# Patient Record
Sex: Female | Born: 2000 | Race: Black or African American | Hispanic: No | Marital: Single
Health system: Southern US, Community
[De-identification: ages and names within clinical notes are randomized; demographics above are authoritative.]

## PROBLEM LIST (undated history)

## (undated) DIAGNOSIS — F419 Anxiety disorder, unspecified: Secondary | ICD-10-CM

## (undated) DIAGNOSIS — J45909 Unspecified asthma, uncomplicated: Secondary | ICD-10-CM

## (undated) HISTORY — DX: Anxiety disorder, unspecified: F41.9

---
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
## 2022-01-20 DIAGNOSIS — Z0000 Encounter for general adult medical examination without abnormal findings: Secondary | ICD-10-CM | POA: Diagnosis not present

## 2022-01-20 DIAGNOSIS — J4520 Mild intermittent asthma, uncomplicated: Secondary | ICD-10-CM | POA: Diagnosis not present

## 2022-01-20 DIAGNOSIS — Z68.41 Body mass index (BMI) pediatric, greater than or equal to 95th percentile for age: Secondary | ICD-10-CM | POA: Diagnosis not present

## 2022-01-20 DIAGNOSIS — Z713 Dietary counseling and surveillance: Secondary | ICD-10-CM | POA: Diagnosis not present

## 2022-01-20 DIAGNOSIS — Z419 Encounter for procedure for purposes other than remedying health state, unspecified: Secondary | ICD-10-CM | POA: Diagnosis not present

## 2022-01-20 DIAGNOSIS — Z23 Encounter for immunization: Secondary | ICD-10-CM | POA: Diagnosis not present

## 2022-01-20 DIAGNOSIS — L83 Acanthosis nigricans: Secondary | ICD-10-CM | POA: Diagnosis not present

## 2022-02-10 DIAGNOSIS — L83 Acanthosis nigricans: Secondary | ICD-10-CM | POA: Diagnosis not present

## 2022-02-10 DIAGNOSIS — Z13220 Encounter for screening for lipoid disorders: Secondary | ICD-10-CM | POA: Diagnosis not present

## 2022-02-20 DIAGNOSIS — Z419 Encounter for procedure for purposes other than remedying health state, unspecified: Secondary | ICD-10-CM | POA: Diagnosis not present

## 2022-03-23 DIAGNOSIS — Z419 Encounter for procedure for purposes other than remedying health state, unspecified: Secondary | ICD-10-CM | POA: Diagnosis not present

## 2022-04-21 DIAGNOSIS — Z419 Encounter for procedure for purposes other than remedying health state, unspecified: Secondary | ICD-10-CM | POA: Diagnosis not present

## 2022-05-22 DIAGNOSIS — Z419 Encounter for procedure for purposes other than remedying health state, unspecified: Secondary | ICD-10-CM | POA: Diagnosis not present

## 2022-06-21 DIAGNOSIS — Z419 Encounter for procedure for purposes other than remedying health state, unspecified: Secondary | ICD-10-CM | POA: Diagnosis not present

## 2022-07-22 DIAGNOSIS — Z419 Encounter for procedure for purposes other than remedying health state, unspecified: Secondary | ICD-10-CM | POA: Diagnosis not present

## 2022-08-21 DIAGNOSIS — Z419 Encounter for procedure for purposes other than remedying health state, unspecified: Secondary | ICD-10-CM | POA: Diagnosis not present

## 2022-09-21 DIAGNOSIS — Z419 Encounter for procedure for purposes other than remedying health state, unspecified: Secondary | ICD-10-CM | POA: Diagnosis not present

## 2022-10-22 DIAGNOSIS — Z419 Encounter for procedure for purposes other than remedying health state, unspecified: Secondary | ICD-10-CM | POA: Diagnosis not present

## 2022-11-21 DIAGNOSIS — Z419 Encounter for procedure for purposes other than remedying health state, unspecified: Secondary | ICD-10-CM | POA: Diagnosis not present

## 2022-11-22 DIAGNOSIS — J4520 Mild intermittent asthma, uncomplicated: Secondary | ICD-10-CM | POA: Diagnosis not present

## 2022-11-22 DIAGNOSIS — Z7189 Other specified counseling: Secondary | ICD-10-CM | POA: Diagnosis not present

## 2022-11-22 DIAGNOSIS — Z23 Encounter for immunization: Secondary | ICD-10-CM | POA: Diagnosis not present

## 2022-11-22 DIAGNOSIS — M205X1 Other deformities of toe(s) (acquired), right foot: Secondary | ICD-10-CM | POA: Diagnosis not present

## 2022-11-22 DIAGNOSIS — Z713 Dietary counseling and surveillance: Secondary | ICD-10-CM | POA: Diagnosis not present

## 2022-11-22 DIAGNOSIS — Z1330 Encounter for screening examination for mental health and behavioral disorders, unspecified: Secondary | ICD-10-CM | POA: Diagnosis not present

## 2022-11-22 DIAGNOSIS — F411 Generalized anxiety disorder: Secondary | ICD-10-CM | POA: Diagnosis not present

## 2022-11-22 DIAGNOSIS — Z0001 Encounter for general adult medical examination with abnormal findings: Secondary | ICD-10-CM | POA: Diagnosis not present

## 2022-11-22 DIAGNOSIS — L209 Atopic dermatitis, unspecified: Secondary | ICD-10-CM | POA: Diagnosis not present

## 2022-11-22 DIAGNOSIS — J309 Allergic rhinitis, unspecified: Secondary | ICD-10-CM | POA: Diagnosis not present

## 2022-11-22 DIAGNOSIS — Z6833 Body mass index (BMI) 33.0-33.9, adult: Secondary | ICD-10-CM | POA: Diagnosis not present

## 2022-12-22 DIAGNOSIS — Z419 Encounter for procedure for purposes other than remedying health state, unspecified: Secondary | ICD-10-CM | POA: Diagnosis not present

## 2023-01-03 ENCOUNTER — Ambulatory Visit
Admission: EM | Admit: 2023-01-03 | Discharge: 2023-01-03 | Disposition: A | Discharge status: Home / Self Care | Payer: Medicaid Other | Attending: Family Medicine | Admitting: Family Medicine

## 2023-01-03 DIAGNOSIS — J019 Acute sinusitis, unspecified: Secondary | ICD-10-CM | POA: Diagnosis not present

## 2023-01-03 DIAGNOSIS — H66001 Acute suppurative otitis media without spontaneous rupture of ear drum, right ear: Secondary | ICD-10-CM | POA: Diagnosis not present

## 2023-01-03 MED ORDER — PREDNISONE 10 MG (21) PO TBPK: ORAL_TABLET | Freq: Every day | ORAL | 0 refills | Status: DC

## 2023-01-03 MED ORDER — CEFDINIR 300 MG PO CAPS: 300.0000 mg | ORAL_CAPSULE | Freq: Two times a day (BID) | ORAL | 0 refills | Status: DC

## 2023-01-03 NOTE — ED Triage Notes (Signed)
Pt c/o R sided facial pain & congestion x3 wks & ear pain x1 day. Has tried dayquil & zinc w/o relief.

## 2023-01-03 NOTE — Discharge Instructions (Signed)
Stop by the pharmacy to pick up your prescriptions.  Follow up with your primary care provider as needed. You may need to see an ear, nose and throat doctor, if symptoms don't improve.

## 2023-01-03 NOTE — ED Provider Notes (Signed)
[facility]    CSN: [id number] Arrival date & time: 01/03/23  1712      History   Chief Complaint Chief Complaint  Patient presents with   Facial Pain    HPI Destiny Jensen is a 22 y.o. female.   HPI  History obtained from the patient. Destiny Jensen presents for right sided facial pain that started at the end of October (10/25) that has gotten worse. She has right ear pain, coughing, sneezing and nasal congestion. No fever, nausea, diarrhea. Had 1 episode of vomiting last Tuesday. She has had sinus congestion and sinus pain for past 3-4 weeks. Has headache but not one currently.        Past Medical History:  Diagnosis Date   Anxiety    Asthma     Patient Active Problem List   Diagnosis Date Noted   Attention deficit hyperactivity disorder (ADHD), combined type 04/23/2018   Other specified anxiety disorders 04/23/2018    History reviewed. No pertinent surgical history.  OB History   No obstetric history on file.      Home Medications    Prior to Admission medications   Medication Sig Start Date End Date Taking? Authorizing Provider  albuterol (PROVENTIL HFA;VENTOLIN HFA) 108 (90 Base) MCG/ACT inhaler Inhale 1-2 puffs into the lungs every 6 (six) hours as needed for wheezing or shortness of breath. 04/12/15  Yes Hagler, Jami L, PA-C  albuterol (PROVENTIL) (2.5 MG/3ML) 0.083% nebulizer solution Take 2.5 mg by nebulization every 6 (six) hours as needed for wheezing or shortness of breath.   Yes [provider]  amitriptyline (ELAVIL) 25 MG tablet Take 1 tablet (25 mg total) by mouth at bedtime. 03/12/19  Yes Keturah Shavers, MD  buPROPion (WELLBUTRIN XL) 300 MG 24 hr tablet Take 1 tablet (300 mg total) by mouth daily. 09/13/18  Yes Darcel Smalling, MD  escitalopram (LEXAPRO) 20 MG tablet TAKE 1 TABLET BY MOUTH EVERY DAY 10/08/18  Yes Darcel Smalling, MD  FLOVENT HFA 44 MCG/ACT inhaler Inhale 2 puffs into the lungs 2 (two) times daily. 03/08/18  Yes  [provider]  fluticasone (FLONASE) 50 MCG/ACT nasal spray Place 1 spray into both nostrils daily.   Yes [provider]  loratadine (CLARITIN) 10 MG tablet Take 10 mg by mouth daily.   Yes [provider]  Magnesium Oxide 500 MG TABS Take 1 tablet (500 mg total) by mouth daily. 03/12/19  Yes Keturah Shavers, MD  naproxen (NAPROSYN) 500 MG tablet Take 500 mg by mouth 2 (two) times daily. 11/22/22  Yes [provider]  predniSONE (STERAPRED UNI-PAK 21 TAB) 10 MG (21) TBPK tablet Take by mouth daily. Take 6 tabs by mouth daily for 1, then 5 tabs for 1 day, then 4 tabs for 1 day, then 3 tabs for 1 day, then 2 tabs for 1 day, then 1 tab for 1 day. 01/03/23  Yes Dashiell Franchino, DO  riboflavin (VITAMIN B-2) 100 MG TABS tablet Take 1 tablet (100 mg total) by mouth daily. 03/12/19  Yes Keturah Shavers, MD  sertraline (ZOLOFT) 25 MG tablet Take 25 mg by mouth daily. 11/22/22  Yes [provider]  cefdinir (OMNICEF) 300 MG capsule Take 1 capsule (300 mg total) by mouth 2 (two) times daily. 01/03/23  Yes Lyzette Reinhardt, DO  lisdexamfetamine (VYVANSE) 30 MG capsule Take 1 capsule (30 mg total) by mouth daily. Patient not taking: Reported on 03/12/2019 07/23/18   Darcel Smalling, MD    Family History  Family History  Problem Relation Age of Onset   Migraines Mother    Seizures Neg Hx    Autism Neg Hx    ADD / ADHD Neg Hx    Anxiety disorder Neg Hx    Depression Neg Hx    Bipolar disorder Neg Hx    Schizophrenia Neg Hx     Social History Social History   Tobacco Use   Smoking status: Passive Smoke Exposure - Never Smoker   Smokeless tobacco: Never  Vaping Use   Vaping status: Never Used  Substance Use Topics   Alcohol use: No   Drug use: No     Allergies   Patient has no known allergies.   Review of Systems Review of Systems: negative unless otherwise stated in HPI.      Physical Exam Triage Vital Signs ED Triage Vitals  Encounter  Vitals Group     BP 01/03/23 1744 131/83     Systolic BP Percentile --      Diastolic BP Percentile --      Pulse Rate 01/03/23 1744 100     Resp 01/03/23 1744 16     Temp 01/03/23 1744 99 F (37.2 C)     Temp Source 01/03/23 1744 Oral     SpO2 01/03/23 1744 98 %     Weight 01/03/23 1743 210 lb (95.3 kg)     Height 01/03/23 1743 5\' 5"  (1.651 m)     Head Circumference --      Peak Flow --      Pain Score 01/03/23 1748 8     Pain Loc --      Pain Education --      Exclude from Growth Chart --    No data found.  Updated Vital Signs BP 131/83 (BP Location: Left Arm)   Pulse 100   Temp 99 F (37.2 C) (Oral)   Resp 16   Ht 5\' 5"  (1.651 m)   Wt 95.3 kg   LMP 11/21/2022 (Approximate)   SpO2 98%   BMI 34.95 kg/m   Visual Acuity Right Eye Distance:   Left Eye Distance:   Bilateral Distance:    Right Eye Near:   Left Eye Near:    Bilateral Near:     Physical Exam GEN:     alert, non-toxic appearing female in no distress    HENT:  mucus membranes moist, oropharyngeal without lesions or erythema, 2+ tonsillar hypertrophy or exudates,  moderate erythematous edematous turbinates, clear nasal discharge,right TM bulging and opaque, maxillary sinus tendenress, no frontal since tenderness  EYES:   pupils equal and reactive, no scleral injection or discharge NECK:  normal ROM, + right lymphadenopathy, no meningismus   RESP:  no increased work of breathing, clear to auscultation bilaterally CVS:   regular rate and rhythm Skin:   warm and dry, no rash on visible skin    UC Treatments / Results  Labs (all labs ordered are listed, but only abnormal results are displayed) Labs Reviewed - No data to display  EKG   Radiology No results found.  Procedures Procedures (including critical care time)  Medications Ordered in UC Medications - No data to display  Initial Impression / Assessment and Plan / UC Course  I have reviewed the triage vital signs and the nursing  notes.  Pertinent labs & imaging results that were available during my care of the patient were reviewed by me and considered in my medical decision making (see chart for details).  Pt is a 22 y.o. female who presents for 3-4 weeks of nasal congestion with right facial pain and ear pain.   Destiny Jensen is afebrile here.  Satting wellon room air. Overall pt is  non-toxic appearing, well hydrated, without respiratory distress. Pulmonary exam is unremarkable except for frequent cough. After shared decision making, we will not pursue chest x-ray at this time.  COVID  and influenza testing deferred due to length of symptoms.  She has maxillary sinus tenderness on the right with evidence of beginning acute otitis media also on the right.    Treat acute sinusitis with right acute otitis media with steroids and antibiotics as below.  Typical duration of symptoms discussed. Return and ED precautions given and patient voiced understanding.   Discussed MDM, treatment plan and plan for follow-up with patient who agrees with plan.     Final Clinical Impressions(s) / UC Diagnoses   Final diagnoses:  Acute non-recurrent sinusitis, unspecified location  Non-recurrent acute suppurative otitis media of right ear without spontaneous rupture of tympanic membrane     Discharge Instructions      Stop by the pharmacy to pick up your prescriptions.  Follow up with your primary care provider as needed. You may need to see an ear, nose and throat doctor, if symptoms don't improve.       ED Prescriptions     Medication Sig Dispense Auth. Provider   cefdinir (OMNICEF) 300 MG capsule Take 1 capsule (300 mg total) by mouth 2 (two) times daily. 14 capsule Halia Franey, DO   predniSONE (STERAPRED UNI-PAK 21 TAB) 10 MG (21) TBPK tablet Take by mouth daily. Take 6 tabs by mouth daily for 1, then 5 tabs for 1 day, then 4 tabs for 1 day, then 3 tabs for 1 day, then 2 tabs for 1 day, then 1 tab for 1 day. 21  tablet Katha Cabal, DO      PDMP not reviewed this encounter.   Katha Cabal, DO 01/03/23 1824

## 2023-01-09 ENCOUNTER — Ambulatory Visit
Admission: EM | Admit: 2023-01-09 | Discharge: 2023-01-09 | Disposition: A | Discharge status: Home / Self Care | Payer: Medicaid Other | Attending: Emergency Medicine | Admitting: Emergency Medicine

## 2023-01-09 DIAGNOSIS — J029 Acute pharyngitis, unspecified: Secondary | ICD-10-CM | POA: Diagnosis not present

## 2023-01-09 DIAGNOSIS — H9203 Otalgia, bilateral: Secondary | ICD-10-CM

## 2023-01-09 LAB — GROUP A STREP BY PCR: Group A Strep by PCR: NOT DETECTED

## 2023-01-09 NOTE — ED Provider Notes (Signed)
[facility]    CSN: [id number] Arrival date & time: 01/09/23  1705      History   Chief Complaint Chief Complaint  Patient presents with   Otalgia    HPI Destiny Jensen is a 22 y.o. female.   22 year old female, Destiny Jensen, presents to urgent care for evaluation of bilateral ear pain x 1 week.  Patient was seen 1113 for the same issue was given cefdinir and prednisone patient states she feels little bit better but has not gone away completely, points to the tragus bilaterally as point of tenderness.  Patient also complaining of sore throat.  Patient is afebrile in office, vital signs are stable  The history is provided by the patient. No language interpreter was used.    Past Medical History:  Diagnosis Date   Anxiety    Asthma     Patient Active Problem List   Diagnosis Date Noted   Otalgia of both ears 01/09/2023   Sore throat 01/09/2023   Attention deficit hyperactivity disorder (ADHD), combined type 04/23/2018   Other specified anxiety disorders 04/23/2018    History reviewed. No pertinent surgical history.  OB History   No obstetric history on file.      Home Medications    Prior to Admission medications   Medication Sig Start Date End Date Taking? Authorizing Provider  albuterol (PROVENTIL HFA;VENTOLIN HFA) 108 (90 Base) MCG/ACT inhaler Inhale 1-2 puffs into the lungs every 6 (six) hours as needed for wheezing or shortness of breath. 04/12/15  Yes Hagler, Jami L, PA-C  albuterol (PROVENTIL) (2.5 MG/3ML) 0.083% nebulizer solution Take 2.5 mg by nebulization every 6 (six) hours as needed for wheezing or shortness of breath.   Yes [provider]  amitriptyline (ELAVIL) 25 MG tablet Take 1 tablet (25 mg total) by mouth at bedtime. 03/12/19  Yes Keturah Shavers, MD  buPROPion (WELLBUTRIN XL) 300 MG 24 hr tablet Take 1 tablet (300 mg total) by mouth daily. 09/13/18  Yes Darcel Smalling, MD  cefdinir (OMNICEF) 300 MG capsule Take 1 capsule  (300 mg total) by mouth 2 (two) times daily. 01/03/23  Yes Brimage, Vondra, DO  escitalopram (LEXAPRO) 20 MG tablet TAKE 1 TABLET BY MOUTH EVERY DAY 10/08/18  Yes Darcel Smalling, MD  FLOVENT HFA 44 MCG/ACT inhaler Inhale 2 puffs into the lungs 2 (two) times daily. 03/08/18  Yes [provider]  fluticasone (FLONASE) 50 MCG/ACT nasal spray Place 1 spray into both nostrils daily.   Yes [provider]  loratadine (CLARITIN) 10 MG tablet Take 10 mg by mouth daily.   Yes [provider]  Magnesium Oxide 500 MG TABS Take 1 tablet (500 mg total) by mouth daily. 03/12/19  Yes Keturah Shavers, MD  naproxen (NAPROSYN) 500 MG tablet Take 500 mg by mouth 2 (two) times daily. 11/22/22  Yes [provider]  riboflavin (VITAMIN B-2) 100 MG TABS tablet Take 1 tablet (100 mg total) by mouth daily. 03/12/19  Yes Keturah Shavers, MD  sertraline (ZOLOFT) 25 MG tablet Take 25 mg by mouth daily. 11/22/22  Yes [provider]  lisdexamfetamine (VYVANSE) 30 MG capsule Take 1 capsule (30 mg total) by mouth daily. Patient not taking: Reported on 03/12/2019 07/23/18   Darcel Smalling, MD  predniSONE (STERAPRED UNI-PAK 21 TAB) 10 MG (21) TBPK tablet Take by mouth daily. Take 6 tabs by mouth daily for 1, then 5 tabs for 1 day, then 4 tabs for 1 day, then 3 tabs for  1 day, then 2 tabs for 1 day, then 1 tab for 1 day. 01/03/23   Katha Cabal, DO    Family History Family History  Problem Relation Age of Onset   Migraines Mother    Seizures Neg Hx    Autism Neg Hx    ADD / ADHD Neg Hx    Anxiety disorder Neg Hx    Depression Neg Hx    Bipolar disorder Neg Hx    Schizophrenia Neg Hx     Social History Social History   Tobacco Use   Smoking status: Passive Smoke Exposure - Never Smoker   Smokeless tobacco: Never  Vaping Use   Vaping status: Never Used  Substance Use Topics   Alcohol use: No   Drug use: No     Allergies   Patient has no known allergies.   Review of  Systems Review of Systems  Constitutional:  Negative for fever.  HENT:  Positive for ear pain and sore throat. Negative for ear discharge and facial swelling.   All other systems reviewed and are negative.    Physical Exam Triage Vital Signs ED Triage Vitals [01/09/23 1718]  Encounter Vitals Group     BP      Systolic BP Percentile      Diastolic BP Percentile      Pulse      Resp      Temp      Temp src      SpO2      Weight 210 lb (95.3 kg)     Height 5\' 5"  (1.651 m)     Head Circumference      Peak Flow      Pain Score      Pain Loc      Pain Education      Exclude from Growth Chart    No data found.  Updated Vital Signs BP 139/85 (BP Location: Right Arm)   Pulse 75   Temp 98.5 F (36.9 C) (Oral)   Resp 16   Ht 5\' 5"  (1.651 m)   Wt 210 lb (95.3 kg)   LMP 11/21/2022 (Approximate)   SpO2 99%   BMI 34.95 kg/m   Visual Acuity Right Eye Distance:   Left Eye Distance:   Bilateral Distance:    Right Eye Near:   Left Eye Near:    Bilateral Near:     Physical Exam Vitals and nursing note reviewed.  Constitutional:      Appearance: She is well-developed, well-groomed and overweight.  HENT:     Head: Normocephalic.     Right Ear: Tympanic membrane normal.     Left Ear: Tympanic membrane normal.     Ears:     Comments: No mastoid tenderness or erythema, no trismus    Nose: Nose normal.     Mouth/Throat:     Lips: Pink.     Mouth: Mucous membranes are moist.     Pharynx: Uvula midline. Posterior oropharyngeal erythema present. No oropharyngeal exudate.     Tonsils: No tonsillar exudate or tonsillar abscesses.  Cardiovascular:     Rate and Rhythm: Normal rate and regular rhythm.     Pulses: Normal pulses.     Heart sounds: Normal heart sounds.  Pulmonary:     Effort: Pulmonary effort is normal.     Breath sounds: Normal breath sounds and air entry.  Neurological:     General: No focal deficit present.     Mental Status: She is  alert and oriented to  person, place, and time.     GCS: GCS eye subscore is 4. GCS verbal subscore is 5. GCS motor subscore is 6.     Cranial Nerves: No cranial nerve deficit.     Sensory: No sensory deficit.  Psychiatric:        Attention and Perception: Attention normal.        Mood and Affect: Mood normal.        Speech: Speech normal.        Behavior: Behavior normal. Behavior is cooperative.      UC Treatments / Results  Labs (all labs ordered are listed, but only abnormal results are displayed) Labs Reviewed  GROUP A STREP BY PCR    EKG   Radiology No results found.  Procedures Procedures (including critical care time)  Medications Ordered in UC Medications - No data to display  Initial Impression / Assessment and Plan / UC Course  I have reviewed the triage vital signs and the nursing notes.  Pertinent labs & imaging results that were available during my care of the patient were reviewed by me and considered in my medical decision making (see chart for details).  Clinical Course as of 01/09/23 2058  Tue Jan 09, 2023  1734 Strep ordered [JD]  6213 Strep is negative will refer to ENT for further evaluation, complete meds.  [JD]    Clinical Course User Index [JD] Tymber Stallings, Para March, NP  Discussed exam findings and plan of care with patient, referred to ENT ,strict go to ER precautions given.   Patient verbalized understanding to this provider.  Ddx: Otalgia bilaterally, sore throat, allergies, viral illness Final Clinical Impressions(s) / UC Diagnoses   Final diagnoses:  Otalgia of both ears  Sore throat     Discharge Instructions      Your strep test is negative. Take daily allergy med of choice, take flonase as label directed(both are over the counter). Call and follow up with ENT. Return as needed.     ED Prescriptions   None    PDMP not reviewed this encounter.   Clancy Gourd, NP 01/09/23 [phone]

## 2023-01-09 NOTE — Discharge Instructions (Addendum)
Your strep test is negative. Take daily allergy med of choice, take flonase as label directed(both are over the counter). Call and follow up with ENT. Return as needed.

## 2023-01-09 NOTE — ED Triage Notes (Signed)
Pt c/o bilateral ear pain x1 wk. Was seen on 11/13 for the same issue. Was given cefdinir & prednisone w/o relief.

## 2023-01-21 DIAGNOSIS — Z419 Encounter for procedure for purposes other than remedying health state, unspecified: Secondary | ICD-10-CM | POA: Diagnosis not present

## 2023-02-21 DIAGNOSIS — Z419 Encounter for procedure for purposes other than remedying health state, unspecified: Secondary | ICD-10-CM | POA: Diagnosis not present

## 2023-03-24 DIAGNOSIS — Z419 Encounter for procedure for purposes other than remedying health state, unspecified: Secondary | ICD-10-CM | POA: Diagnosis not present

## 2023-04-17 ENCOUNTER — Ambulatory Visit
Admission: EM | Admit: 2023-04-17 | Discharge: 2023-04-17 | Disposition: A | Discharge status: Home / Self Care | Payer: Medicaid Other | Attending: Physician Assistant | Admitting: Physician Assistant

## 2023-04-17 DIAGNOSIS — K047 Periapical abscess without sinus: Secondary | ICD-10-CM | POA: Diagnosis not present

## 2023-04-17 DIAGNOSIS — K029 Dental caries, unspecified: Secondary | ICD-10-CM | POA: Diagnosis not present

## 2023-04-17 MED ORDER — AMOXICILLIN-POT CLAVULANATE 875-125 MG PO TABS: 1.0000 | ORAL_TABLET | Freq: Two times a day (BID) | ORAL | 0 refills | Status: AC

## 2023-04-17 NOTE — Discharge Instructions (Addendum)
-  Take antibiotics for dental infection. - May apply ice to cheek to help with swelling discomfort.  May take Tylenol Motrin for pain. - If fever, increased swelling, worsening pain before you see the dentist go to the ER. - Follow-up with dentist.

## 2023-04-17 NOTE — ED Triage Notes (Signed)
 Pt c/o lower L sided dental pain x1 day. Is sch to see dentist on Monday.

## 2023-04-17 NOTE — ED Provider Notes (Signed)
 [facility]    CSN: [id number] Arrival date & time: 04/17/23  1815      History   Chief Complaint Chief Complaint  Patient presents with   Dental Pain    HPI Destiny Jensen is a 23 y.o. female presenting for pain of left third molar on the lower side for the past couple days.  She says she noticed that the tooth broke about a month ago.  Also reports the left cheek seems to be a little swollen.  No associated fevers.  Has been able to manage her pain with ibuprofen.  Has an appointment to see her dentist in about 6 days but would like something to help treat infection at this time.  HPI  Past Medical History:  Diagnosis Date   Anxiety    Asthma     Patient Active Problem List   Diagnosis Date Noted   Otalgia of both ears 01/09/2023   Sore throat 01/09/2023   Attention deficit hyperactivity disorder (ADHD), combined type 04/23/2018   Other specified anxiety disorders 04/23/2018    History reviewed. No pertinent surgical history.  OB History   No obstetric history on file.      Home Medications    Prior to Admission medications   Medication Sig Start Date End Date Taking? Authorizing Provider  albuterol (PROVENTIL HFA;VENTOLIN HFA) 108 (90 Base) MCG/ACT inhaler Inhale 1-2 puffs into the lungs every 6 (six) hours as needed for wheezing or shortness of breath. 04/12/15  Yes Hagler, Jami L, PA-C  albuterol (PROVENTIL) (2.5 MG/3ML) 0.083% nebulizer solution Take 2.5 mg by nebulization every 6 (six) hours as needed for wheezing or shortness of breath.   Yes [provider]  amitriptyline (ELAVIL) 25 MG tablet Take 1 tablet (25 mg total) by mouth at bedtime. 03/12/19  Yes Keturah Shavers, MD  amoxicillin-clavulanate (AUGMENTIN) 875-125 MG tablet Take 1 tablet by mouth every 12 (twelve) hours for 7 days. 04/17/23 04/24/23 Yes Shirlee Latch, PA-C  buPROPion (WELLBUTRIN XL) 300 MG 24 hr tablet Take 1 tablet (300 mg total) by mouth daily. 09/13/18  Yes Darcel Smalling, MD  escitalopram (LEXAPRO) 20 MG tablet TAKE 1 TABLET BY MOUTH EVERY DAY 10/08/18  Yes Darcel Smalling, MD  FLOVENT HFA 44 MCG/ACT inhaler Inhale 2 puffs into the lungs 2 (two) times daily. 03/08/18  Yes [provider]  fluticasone (FLONASE) 50 MCG/ACT nasal spray Place 1 spray into both nostrils daily.   Yes [provider]  loratadine (CLARITIN) 10 MG tablet Take 10 mg by mouth daily.   Yes [provider]  Magnesium Oxide 500 MG TABS Take 1 tablet (500 mg total) by mouth daily. 03/12/19  Yes Keturah Shavers, MD  naproxen (NAPROSYN) 500 MG tablet Take 500 mg by mouth 2 (two) times daily. 11/22/22  Yes [provider]  riboflavin (VITAMIN B-2) 100 MG TABS tablet Take 1 tablet (100 mg total) by mouth daily. 03/12/19  Yes Keturah Shavers, MD  sertraline (ZOLOFT) 25 MG tablet Take 25 mg by mouth daily. 11/22/22  Yes [provider]  lisdexamfetamine (VYVANSE) 30 MG capsule Take 1 capsule (30 mg total) by mouth daily. Patient not taking: Reported on 03/12/2019 07/23/18   Darcel Smalling, MD    Family History Family History  Problem Relation Age of Onset   Migraines Mother    Seizures Neg Hx    Autism Neg Hx    ADD / ADHD Neg Hx    Anxiety disorder Neg Hx  Depression Neg Hx    Bipolar disorder Neg Hx    Schizophrenia Neg Hx     Social History Social History   Tobacco Use   Smoking status: Passive Smoke Exposure - Never Smoker   Smokeless tobacco: Never  Vaping Use   Vaping status: Never Used  Substance Use Topics   Alcohol use: No   Drug use: No     Allergies   Patient has no known allergies.   Review of Systems Review of Systems  Constitutional:  Negative for fatigue and fever.  HENT:  Positive for dental problem and facial swelling. Negative for trouble swallowing.   Neurological:  Negative for weakness and headaches.  Hematological:  Negative for adenopathy.     Physical Exam Triage Vital Signs ED Triage Vitals   Encounter Vitals Group     BP 04/17/23 1819 (!) 143/88     Systolic BP Percentile --      Diastolic BP Percentile --      Pulse Rate 04/17/23 1819 83     Resp 04/17/23 1819 16     Temp 04/17/23 1819 98.4 F (36.9 C)     Temp Source 04/17/23 1819 Oral     SpO2 04/17/23 1819 98 %     Weight 04/17/23 1818 210 lb (95.3 kg)     Height 04/17/23 1818 5\' 5"  (1.651 m)     Head Circumference --      Peak Flow --      Pain Score 04/17/23 1822 5     Pain Loc --      Pain Education --      Exclude from Growth Chart --    No data found.  Updated Vital Signs BP (!) 143/88 (BP Location: Left Arm)   Pulse 83   Temp 98.4 F (36.9 C) (Oral)   Resp 16   Ht 5\' 5"  (1.651 m)   Wt 210 lb (95.3 kg)   LMP 04/02/2023 (Approximate)   SpO2 98%   BMI 34.95 kg/m   Visual Acuity Right Eye Distance:   Left Eye Distance:   Bilateral Distance:    Right Eye Near:   Left Eye Near:    Bilateral Near:     Physical Exam Vitals and nursing note reviewed.  Constitutional:      General: She is not in acute distress.    Appearance: Normal appearance. She is not ill-appearing or toxic-appearing.  HENT:     Head: Normocephalic and atraumatic.     Right Ear: Tympanic membrane, ear canal and external ear normal.     Left Ear: Tympanic membrane, ear canal and external ear normal.     Nose: Nose normal.     Mouth/Throat:     Mouth: Mucous membranes are moist.     Dentition: Dental caries (left lower 3rd molar with decay and is broken. TTP) present.     Pharynx: Oropharynx is clear.     Comments: No significant facial swelling Eyes:     General: No scleral icterus.       Right eye: No discharge.        Left eye: No discharge.     Conjunctiva/sclera: Conjunctivae normal.  Cardiovascular:     Rate and Rhythm: Normal rate.  Pulmonary:     Effort: Pulmonary effort is normal. No respiratory distress.  Musculoskeletal:     Cervical back: Neck supple.  Skin:    General: Skin is dry.  Neurological:      General: No focal  deficit present.     Mental Status: She is alert. Mental status is at baseline.     Motor: No weakness.     Gait: Gait normal.  Psychiatric:        Mood and Affect: Mood normal.        Behavior: Behavior normal.      UC Treatments / Results  Labs (all labs ordered are listed, but only abnormal results are displayed) Labs Reviewed - No data to display  EKG   Radiology No results found.  Procedures Procedures (including critical care time)  Medications Ordered in UC Medications - No data to display  Initial Impression / Assessment and Plan / UC Course  I have reviewed the triage vital signs and the nursing notes.  Pertinent labs & imaging results that were available during my care of the patient were reviewed by me and considered in my medical decision making (see chart for details).   23 year old female presents for pain of the left third molar on the lower side for the past couple days.  Has been taking ibuprofen for pain which has helped.  Has an appointment to see dentist in a little less than a week.  Vitals are stable.  Afebrile.  Overall well-appearing.  On exam she has decay and broken left lower third molar will treat for infection at this time with Augmentin.  Advised to continue ibuprofen and Tylenol, rest and fluids.  Reviewed return and ER precautions, otherwise follow-up as scheduled with dentist.   Final Clinical Impressions(s) / UC Diagnoses   Final diagnoses:  Dental infection  Dental caries     Discharge Instructions      -Take antibiotics for dental infection. - May apply ice to cheek to help with swelling discomfort.  May take Tylenol Motrin for pain. - If fever, increased swelling, worsening pain before you see the dentist go to the ER. - Follow-up with dentist.     ED Prescriptions     Medication Sig Dispense Auth. Provider   amoxicillin-clavulanate (AUGMENTIN) 875-125 MG tablet Take 1 tablet by mouth every 12 (twelve)  hours for 7 days. 14 tablet Shirlee Latch, PA-C      I have reviewed the PDMP during this encounter.   Shirlee Latch, PA-C 04/17/23 Windell Moment

## 2023-04-21 DIAGNOSIS — Z419 Encounter for procedure for purposes other than remedying health state, unspecified: Secondary | ICD-10-CM | POA: Diagnosis not present

## 2023-05-15 ENCOUNTER — Encounter: Payer: Self-pay | Admitting: Emergency Medicine

## 2023-05-15 ENCOUNTER — Ambulatory Visit
Admission: EM | Admit: 2023-05-15 | Discharge: 2023-05-15 | Disposition: A | Discharge status: Home / Self Care | Attending: Physician Assistant | Admitting: Physician Assistant

## 2023-05-15 DIAGNOSIS — K0889 Other specified disorders of teeth and supporting structures: Secondary | ICD-10-CM

## 2023-05-15 DIAGNOSIS — K029 Dental caries, unspecified: Secondary | ICD-10-CM | POA: Diagnosis not present

## 2023-05-15 MED ORDER — HYDROCODONE-ACETAMINOPHEN 5-325 MG PO TABS: 1.0000 | ORAL_TABLET | Freq: Four times a day (QID) | ORAL | 0 refills | Status: AC | PRN

## 2023-05-15 MED ORDER — KETOROLAC TROMETHAMINE 60 MG/2ML IM SOLN
30.0000 mg | Freq: Once | INTRAMUSCULAR | Status: AC
  Administered 2023-05-15: 30 mg via INTRAMUSCULAR

## 2023-05-15 MED ORDER — IBUPROFEN 800 MG PO TABS: 800.0000 mg | ORAL_TABLET | Freq: Three times a day (TID) | ORAL | 0 refills | Status: AC | PRN

## 2023-05-15 MED ORDER — AMOXICILLIN-POT CLAVULANATE 875-125 MG PO TABS
1.0000 | ORAL_TABLET | Freq: Two times a day (BID) | ORAL | 0 refills | Status: AC
Start: 2023-05-15 — End: 2023-05-25

## 2023-05-15 NOTE — Discharge Instructions (Addendum)
-  Make appointment with orthodontist to have the tooth pulled as soon as possible.  We will not refill pain medication. You will continue to have pain until you have the tooth removed.

## 2023-05-15 NOTE — ED Triage Notes (Signed)
 Pt presents with left side dental pain x 1 month. Pt was seen here on 04/17/23. She had a dentist appointment last week and had to be referred. No appointment has been scheduled at this time.

## 2023-05-15 NOTE — ED Provider Notes (Signed)
 [facility]    CSN: [id number] Arrival date & time: 05/15/23  1811      History   Chief Complaint Chief Complaint  Patient presents with   Dental Pain    HPI Destiny Jensen is a 23 y.o. female presenting for pain of left third molar on the lower side for the past month.  She says she noticed that the tooth broke about a 2 months ago.  Also reports the left cheek seems to be a little swollen.  No associated fevers.  Has been able to manage her pain with ibuprofen until 1 month ago.  Seen here by myself about a month ago and treated for dental infection and advised to follow-up with dentistry.  Saw the dentist recently and states she has been referred to orthodontist to have the tooth pulled.  Does not currently have an appointment set and says it will take her 2 to 3 weeks to get an appointment.  Has been taking ibuprofen and Tylenol high doses over-the-counter and says it is not helping the pain anymore.  She says she finally started to get a little bit of relief the last day of the antibiotics.    HPI  Past Medical History:  Diagnosis Date   Anxiety    Asthma     Patient Active Problem List   Diagnosis Date Noted   Otalgia of both ears 01/09/2023   Sore throat 01/09/2023   Attention deficit hyperactivity disorder (ADHD), combined type 04/23/2018   Other specified anxiety disorders 04/23/2018    History reviewed. No pertinent surgical history.  OB History   No obstetric history on file.      Home Medications    Prior to Admission medications   Medication Sig Start Date End Date Taking? Authorizing Provider  amoxicillin-clavulanate (AUGMENTIN) 875-125 MG tablet Take 1 tablet by mouth every 12 (twelve) hours for 10 days. 05/15/23 05/25/23 Yes Shirlee Latch, PA-C  HYDROcodone-acetaminophen (NORCO/VICODIN) 5-325 MG tablet Take 1 tablet by mouth every 6 (six) hours as needed for up to 5 days. 05/15/23 05/20/23 Yes Eusebio Friendly B, PA-C  ibuprofen (ADVIL) 800 MG  tablet Take 1 tablet (800 mg total) by mouth every 8 (eight) hours as needed. 05/15/23  Yes Eusebio Friendly B, PA-C  albuterol (PROVENTIL HFA;VENTOLIN HFA) 108 (90 Base) MCG/ACT inhaler Inhale 1-2 puffs into the lungs every 6 (six) hours as needed for wheezing or shortness of breath. 04/12/15   Hagler, Jami L, PA-C  albuterol (PROVENTIL) (2.5 MG/3ML) 0.083% nebulizer solution Take 2.5 mg by nebulization every 6 (six) hours as needed for wheezing or shortness of breath.    [provider]  amitriptyline (ELAVIL) 25 MG tablet Take 1 tablet (25 mg total) by mouth at bedtime. 03/12/19   Keturah Shavers, MD  buPROPion (WELLBUTRIN XL) 300 MG 24 hr tablet Take 1 tablet (300 mg total) by mouth daily. 09/13/18   Darcel Smalling, MD  escitalopram (LEXAPRO) 20 MG tablet TAKE 1 TABLET BY MOUTH EVERY DAY 10/08/18   Darcel Smalling, MD  FLOVENT HFA 44 MCG/ACT inhaler Inhale 2 puffs into the lungs 2 (two) times daily. 03/08/18   [provider]  fluticasone (FLONASE) 50 MCG/ACT nasal spray Place 1 spray into both nostrils daily.    [provider]  lisdexamfetamine (VYVANSE) 30 MG capsule Take 1 capsule (30 mg total) by mouth daily. Patient not taking: Reported on 03/12/2019 07/23/18   Darcel Smalling, MD  loratadine (CLARITIN) 10 MG tablet Take 10  mg by mouth daily.    [provider]  Magnesium Oxide 500 MG TABS Take 1 tablet (500 mg total) by mouth daily. 03/12/19   Keturah Shavers, MD  naproxen (NAPROSYN) 500 MG tablet Take 500 mg by mouth 2 (two) times daily. 11/22/22   [provider]  riboflavin (VITAMIN B-2) 100 MG TABS tablet Take 1 tablet (100 mg total) by mouth daily. 03/12/19   Keturah Shavers, MD  sertraline (ZOLOFT) 25 MG tablet Take 25 mg by mouth daily. 11/22/22   [provider]    Family History Family History  Problem Relation Age of Onset   Migraines Mother    Seizures Neg Hx    Autism Neg Hx    ADD / ADHD Neg Hx    Anxiety disorder Neg Hx     Depression Neg Hx    Bipolar disorder Neg Hx    Schizophrenia Neg Hx     Social History Social History   Tobacco Use   Smoking status: Passive Smoke Exposure - Never Smoker   Smokeless tobacco: Never  Vaping Use   Vaping status: Never Used  Substance Use Topics   Alcohol use: No   Drug use: No     Allergies   Patient has no known allergies.   Review of Systems Review of Systems  Constitutional:  Negative for fatigue and fever.  HENT:  Positive for dental problem and facial swelling. Negative for trouble swallowing.   Neurological:  Negative for weakness and headaches.  Hematological:  Negative for adenopathy.     Physical Exam Triage Vital Signs ED Triage Vitals  Encounter Vitals Group     BP 04/17/23 1819 (!) 143/88     Systolic BP Percentile --      Diastolic BP Percentile --      Pulse Rate 04/17/23 1819 83     Resp 04/17/23 1819 16     Temp 04/17/23 1819 98.4 F (36.9 C)     Temp Source 04/17/23 1819 Oral     SpO2 04/17/23 1819 98 %     Weight 04/17/23 1818 210 lb (95.3 kg)     Height 04/17/23 1818 5\' 5"  (1.651 m)     Head Circumference --      Peak Flow --      Pain Score 04/17/23 1822 5     Pain Loc --      Pain Education --      Exclude from Growth Chart --    No data found.  Updated Vital Signs BP 139/83 (BP Location: Right Arm)   Pulse 83   Temp 98.2 F (36.8 C) (Oral)   Resp 16   LMP 04/02/2023 (Approximate)   SpO2 97%    Physical Exam Vitals and nursing note reviewed.  Constitutional:      General: She is not in acute distress.    Appearance: Normal appearance. She is not ill-appearing or toxic-appearing.  HENT:     Head: Normocephalic and atraumatic.     Right Ear: Tympanic membrane, ear canal and external ear normal.     Left Ear: Tympanic membrane, ear canal and external ear normal.     Nose: Nose normal.     Mouth/Throat:     Mouth: Mucous membranes are moist.     Dentition: Dental caries (left lower 3rd molar with decay and  is broken. TTP) present.     Pharynx: Oropharynx is clear.     Comments: No significant facial swelling Eyes:  General: No scleral icterus.       Right eye: No discharge.        Left eye: No discharge.     Conjunctiva/sclera: Conjunctivae normal.  Cardiovascular:     Rate and Rhythm: Normal rate.  Pulmonary:     Effort: Pulmonary effort is normal. No respiratory distress.  Musculoskeletal:     Cervical back: Neck supple.  Skin:    General: Skin is dry.  Neurological:     General: No focal deficit present.     Mental Status: She is alert. Mental status is at baseline.     Motor: No weakness.     Gait: Gait normal.  Psychiatric:        Mood and Affect: Mood normal.        Behavior: Behavior normal.      UC Treatments / Results  Labs (all labs ordered are listed, but only abnormal results are displayed) Labs Reviewed - No data to display  EKG   Radiology No results found.  Procedures Procedures (including critical care time)  Medications Ordered in UC Medications  ketorolac (TORADOL) injection 30 mg (has no administration in time range)    Initial Impression / Assessment and Plan / UC Course  I have reviewed the triage vital signs and the nursing notes.  Pertinent labs & imaging results that were available during my care of the patient were reviewed by me and considered in my medical decision making (see chart for details).   23 year old female presents for pain of the left third molar on the lower side for the past month.  Has been taking ibuprofen and Tylenol without relief.  Has been referred to an orthodontist for removal of tooth but cannot be seen for another 2 to 3 weeks.  Request something stronger to help with pain.    Vitals are stable.  Afebrile.  Overall well-appearing.  On exam she has decay and broken left lower third molar will treat for infection at this time with Augmentin.  Also sent Norco and ibuprofen.  Reviewed the do not provide any refills of  the narcotic pain medication is imperative that she make an appointment ASAP.  Reviewed return and ER precautions, otherwise follow-up as scheduled with orthodontist.  Final Clinical Impressions(s) / UC Diagnoses   Final diagnoses:  Pain, dental  Pain due to dental caries     Discharge Instructions      -Make appointment with orthodontist to have the tooth pulled as soon as possible.  We will not refill pain medication. You will continue to have pain until you have the tooth removed.     ED Prescriptions     Medication Sig Dispense Auth. Provider   amoxicillin-clavulanate (AUGMENTIN) 875-125 MG tablet Take 1 tablet by mouth every 12 (twelve) hours for 10 days. 20 tablet Shirlee Latch, PA-C   HYDROcodone-acetaminophen (NORCO/VICODIN) 5-325 MG tablet Take 1 tablet by mouth every 6 (six) hours as needed for up to 5 days. 20 tablet Eusebio Friendly B, PA-C   ibuprofen (ADVIL) 800 MG tablet Take 1 tablet (800 mg total) by mouth every 8 (eight) hours as needed. 30 tablet Gareth Morgan      PDMP not reviewed this encounter.   Shirlee Latch, PA-C 04/17/23 1908    Shirlee Latch, PA-C 05/15/23 [phone]

## 2023-06-02 DIAGNOSIS — Z419 Encounter for procedure for purposes other than remedying health state, unspecified: Secondary | ICD-10-CM | POA: Diagnosis not present

## 2023-07-02 DIAGNOSIS — Z419 Encounter for procedure for purposes other than remedying health state, unspecified: Secondary | ICD-10-CM | POA: Diagnosis not present

## 2023-07-21 ENCOUNTER — Ambulatory Visit
Admission: EM | Admit: 2023-07-21 | Discharge: 2023-07-21 | Disposition: A | Discharge status: Home / Self Care | Attending: Family Medicine | Admitting: Family Medicine

## 2023-07-21 DIAGNOSIS — H60502 Unspecified acute noninfective otitis externa, left ear: Secondary | ICD-10-CM

## 2023-07-21 MED ORDER — CIPROFLOXACIN-DEXAMETHASONE 0.3-0.1 % OT SUSP: 4.0000 [drp] | Freq: Two times a day (BID) | OTIC | 0 refills | Status: AC

## 2023-07-21 NOTE — ED Triage Notes (Signed)
 Sx x 2 days  Bilateral ear pain Lump below ear on left side

## 2023-07-21 NOTE — ED Provider Notes (Signed)
 [facility]    CSN: [id number] Arrival date & time: 07/21/23  1130      History   Chief Complaint Chief Complaint  Patient presents with   Otalgia    HPI Katrinna Travieso is a 23 y.o. female.   HPI   Karsten presents for left greater than right ear pain with associated fullness, hearing loss, and pressure.  Denies fever, nasal congestion, rhinorrhea, sore throat, vomiting, diarrhea.  Has some left-sided neck discomfort when she pressed on a certain area.  She does not use Q-tips.  She has not been swimming recently but has washed her hair.   Kebrina has otherwise been well and has no other concerns.       Past Medical History:  Diagnosis Date   Anxiety    Asthma     Patient Active Problem List   Diagnosis Date Noted   Otalgia of both ears 01/09/2023   Sore throat 01/09/2023   Attention deficit hyperactivity disorder (ADHD), combined type 04/23/2018   Other specified anxiety disorders 04/23/2018    History reviewed. No pertinent surgical history.  OB History   No obstetric history on file.      Home Medications    Prior to Admission medications   Medication Sig Start Date End Date Taking? Authorizing Provider  ciprofloxacin-dexamethasone (CIPRODEX) OTIC suspension Place 4 drops into both ears 2 (two) times daily. 07/21/23  Yes Anthonia Monger, DO  albuterol  (PROVENTIL  HFA;VENTOLIN  HFA) 108 (90 Base) MCG/ACT inhaler Inhale 1-2 puffs into the lungs every 6 (six) hours as needed for wheezing or shortness of breath. 04/12/15   Hagler, Jami L, PA-C  albuterol  (PROVENTIL ) (2.5 MG/3ML) 0.083% nebulizer solution Take 2.5 mg by nebulization every 6 (six) hours as needed for wheezing or shortness of breath.    [provider]  amitriptyline  (ELAVIL ) 25 MG tablet Take 1 tablet (25 mg total) by mouth at bedtime. 03/12/19   Ventura Gins, MD  buPROPion  (WELLBUTRIN  XL) 300 MG 24 hr tablet Take 1 tablet (300 mg total) by mouth daily. 09/13/18   Umrania,  Hiren M, MD  escitalopram  (LEXAPRO ) 20 MG tablet TAKE 1 TABLET BY MOUTH EVERY DAY 10/08/18   Umrania, Hiren M, MD  FLOVENT HFA 44 MCG/ACT inhaler Inhale 2 puffs into the lungs 2 (two) times daily. 03/08/18   [provider]  fluticasone (FLONASE) 50 MCG/ACT nasal spray Place 1 spray into both nostrils daily.    [provider]  ibuprofen  (ADVIL ) 800 MG tablet Take 1 tablet (800 mg total) by mouth every 8 (eight) hours as needed. 05/15/23   Nancy Axon B, PA-C  lisdexamfetamine (VYVANSE ) 30 MG capsule Take 1 capsule (30 mg total) by mouth daily. Patient not taking: Reported on 03/12/2019 07/23/18   Umrania, Hiren M, MD  loratadine (CLARITIN) 10 MG tablet Take 10 mg by mouth daily.    [provider]  Magnesium  Oxide 500 MG TABS Take 1 tablet (500 mg total) by mouth daily. 03/12/19   Ventura Gins, MD  naproxen (NAPROSYN) 500 MG tablet Take 500 mg by mouth 2 (two) times daily. 11/22/22   [provider]  riboflavin (VITAMIN B-2) 100 MG TABS tablet Take 1 tablet (100 mg total) by mouth daily. 03/12/19   Ventura Gins, MD  sertraline (ZOLOFT) 25 MG tablet Take 25 mg by mouth daily. 11/22/22   [provider]    Family History Family History  Problem Relation Age of Onset   Migraines Mother    Seizures Neg Hx  Autism Neg Hx    ADD / ADHD Neg Hx    Anxiety disorder Neg Hx    Depression Neg Hx    Bipolar disorder Neg Hx    Schizophrenia Neg Hx     Social History Social History   Tobacco Use   Smoking status: Passive Smoke Exposure - Never Smoker   Smokeless tobacco: Never  Vaping Use   Vaping status: Never Used  Substance Use Topics   Alcohol use: No   Drug use: No     Allergies   Patient has no known allergies.   Review of Systems Review of Systems: :negative unless otherwise stated in HPI.      Physical Exam Triage Vital Signs ED Triage Vitals  Encounter Vitals Group     BP 07/21/23 1139 121/80     Systolic BP Percentile --       Diastolic BP Percentile --      Pulse Rate 07/21/23 1139 82     Resp 07/21/23 1139 16     Temp 07/21/23 1139 98.6 F (37 C)     Temp Source 07/21/23 1139 Oral     SpO2 07/21/23 1139 98 %     Weight --      Height --      Head Circumference --      Peak Flow --      Pain Score 07/21/23 1138 3     Pain Loc --      Pain Education --      Exclude from Growth Chart --    No data found.  Updated Vital Signs BP 121/80 (BP Location: Right Arm)   Pulse 82   Temp 98.6 F (37 C) (Oral)   Resp 16   SpO2 98%   Visual Acuity Right Eye Distance:   Left Eye Distance:   Bilateral Distance:    Right Eye Near:   Left Eye Near:    Bilateral Near:     Physical Exam GEN:     alert, non-toxic appearing female in no distress    HENT:  mucus membranes moist, no nasal discharge, right TM erythematous canal, left TM pearly with white discharge in the external auditory canal EYES:   no scleral injection or discharge  NECK:  normal ROM, + left-sided lymphadenopathy RESP:  no increased work of breathing Skin:   warm and dry    UC Treatments / Results  Labs (all labs ordered are listed, but only abnormal results are displayed) Labs Reviewed - No data to display  EKG   Radiology No results found.  Procedures Procedures (including critical care time)  Medications Ordered in UC Medications - No data to display  Initial Impression / Assessment and Plan / UC Course  I have reviewed the triage vital signs and the nursing notes.  Pertinent labs & imaging results that were available during my care of the patient were reviewed by me and considered in my medical decision making (see chart for details).        Acute Otitis externa  Overall patient is well-appearing, well-hydrated and without respiratory distress. Camila is afebrile.  On exam, she has evidence of left otitis externa.  Treat with Ciprodex twice daily for 7 days.  Tylenol /Motrin 's as needed for fever or discomfort.   Recommended regular cleaning of his ipods/headphones. Discussed return and ED precautions, understanding voiced.  If symptoms persist recommended ENT follow-up.   Final Clinical Impressions(s) / UC Diagnoses   Final diagnoses:  Acute otitis  externa of left ear, unspecified type     Discharge Instructions      Stop by the pharmacy to pick up your ear antibiotic drops. Stay up right after using ear drops.  Take ibuprofen  as needed for discomfort. Put cotton balls in your ears to prevent water from getting in during your shower. Do not swim get water into your ear until your symptoms resolve. See handout for more information.    ED Prescriptions     Medication Sig Dispense Auth. Provider   ciprofloxacin-dexamethasone (CIPRODEX) OTIC suspension Place 4 drops into both ears 2 (two) times daily. 7.5 mL Tifani Dack, DO      PDMP not reviewed this encounter.   Kaylie Ritter, DO 07/21/23 1155

## 2023-07-21 NOTE — Discharge Instructions (Signed)
 Stop by the pharmacy to pick up your ear antibiotic drops. Stay up right after using ear drops.  Take ibuprofen  as needed for discomfort. Put cotton balls in your ears to prevent water from getting in during your shower. Do not swim get water into your ear until your symptoms resolve. See handout for more information.

## 2023-08-02 DIAGNOSIS — Z419 Encounter for procedure for purposes other than remedying health state, unspecified: Secondary | ICD-10-CM | POA: Diagnosis not present

## 2023-09-01 DIAGNOSIS — Z419 Encounter for procedure for purposes other than remedying health state, unspecified: Secondary | ICD-10-CM | POA: Diagnosis not present

## 2023-10-02 DIAGNOSIS — Z419 Encounter for procedure for purposes other than remedying health state, unspecified: Secondary | ICD-10-CM | POA: Diagnosis not present

## 2023-11-02 DIAGNOSIS — Z419 Encounter for procedure for purposes other than remedying health state, unspecified: Secondary | ICD-10-CM | POA: Diagnosis not present

## 2023-12-02 DIAGNOSIS — Z419 Encounter for procedure for purposes other than remedying health state, unspecified: Secondary | ICD-10-CM | POA: Diagnosis not present
